# Patient Record
Sex: Male | Born: 2000 | Race: White | Hispanic: No | Marital: Single
Health system: Southern US, Community
[De-identification: ages and names within clinical notes are randomized; demographics above are authoritative.]

## PROBLEM LIST (undated history)

## (undated) ENCOUNTER — Ambulatory Visit: Payer: Self-pay | Source: Home / Self Care

## (undated) DIAGNOSIS — G40209 Localization-related (focal) (partial) symptomatic epilepsy and epileptic syndromes with complex partial seizures, not intractable, without status epilepticus: Secondary | ICD-10-CM

## (undated) DIAGNOSIS — L309 Dermatitis, unspecified: Secondary | ICD-10-CM

## (undated) DIAGNOSIS — R569 Unspecified convulsions: Secondary | ICD-10-CM

## (undated) HISTORY — DX: Unspecified convulsions: R56.9

---
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
## 2018-04-01 IMAGING — DX DG KNEE COMPLETE 4+V*L*
4 series · 4 of 4 positions shown · non-contrast
Comparison: None.

CLINICAL DATA: 16-year-old male status post fall while running with
injury to left knee.

EXAM:
LEFT KNEE - COMPLETE 4+ VIEW

[knee ap]
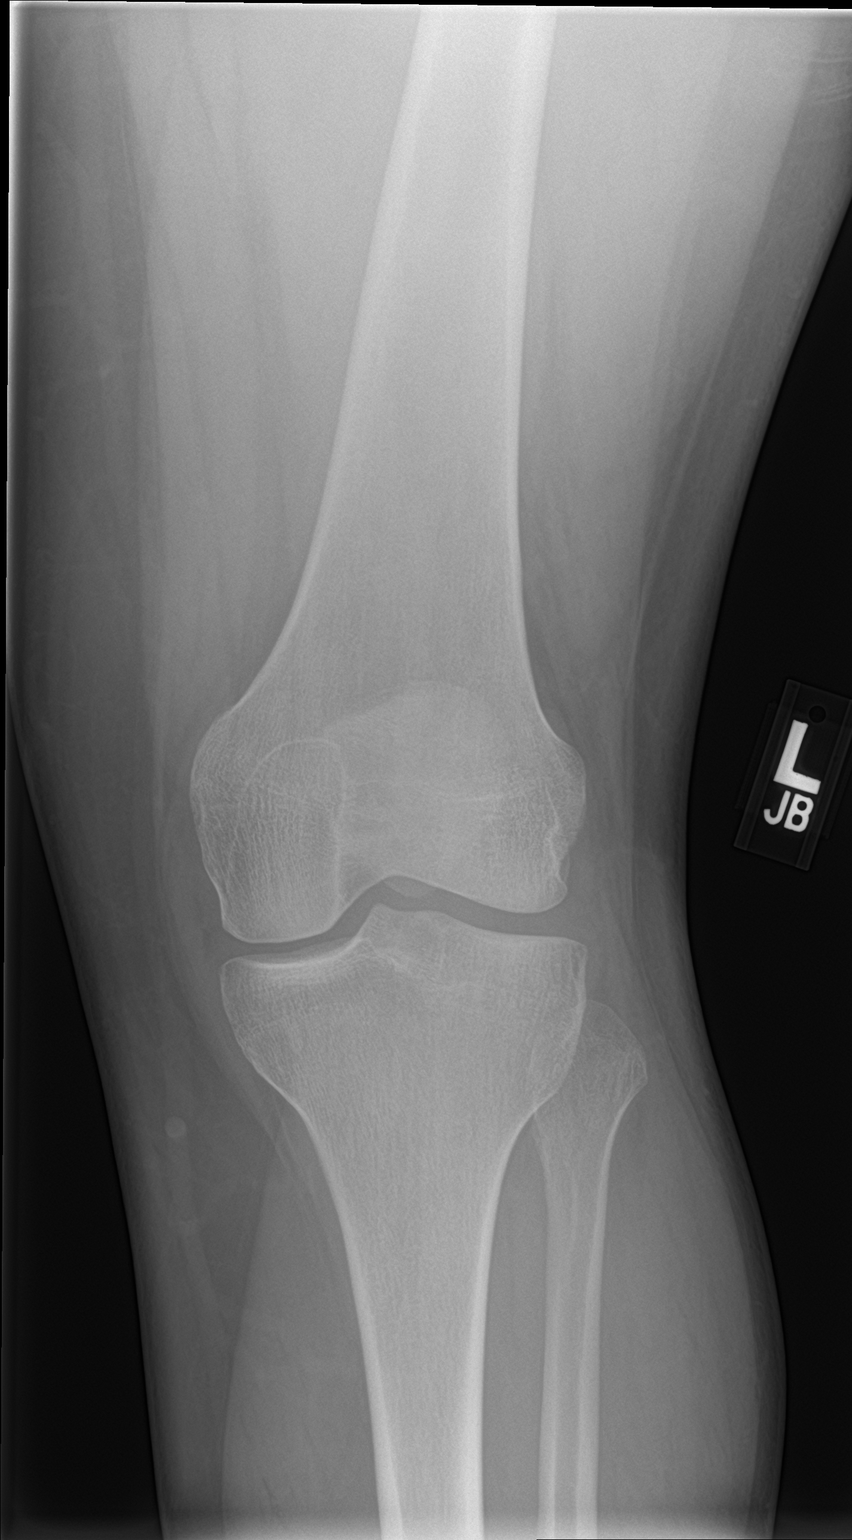

[knee lat]
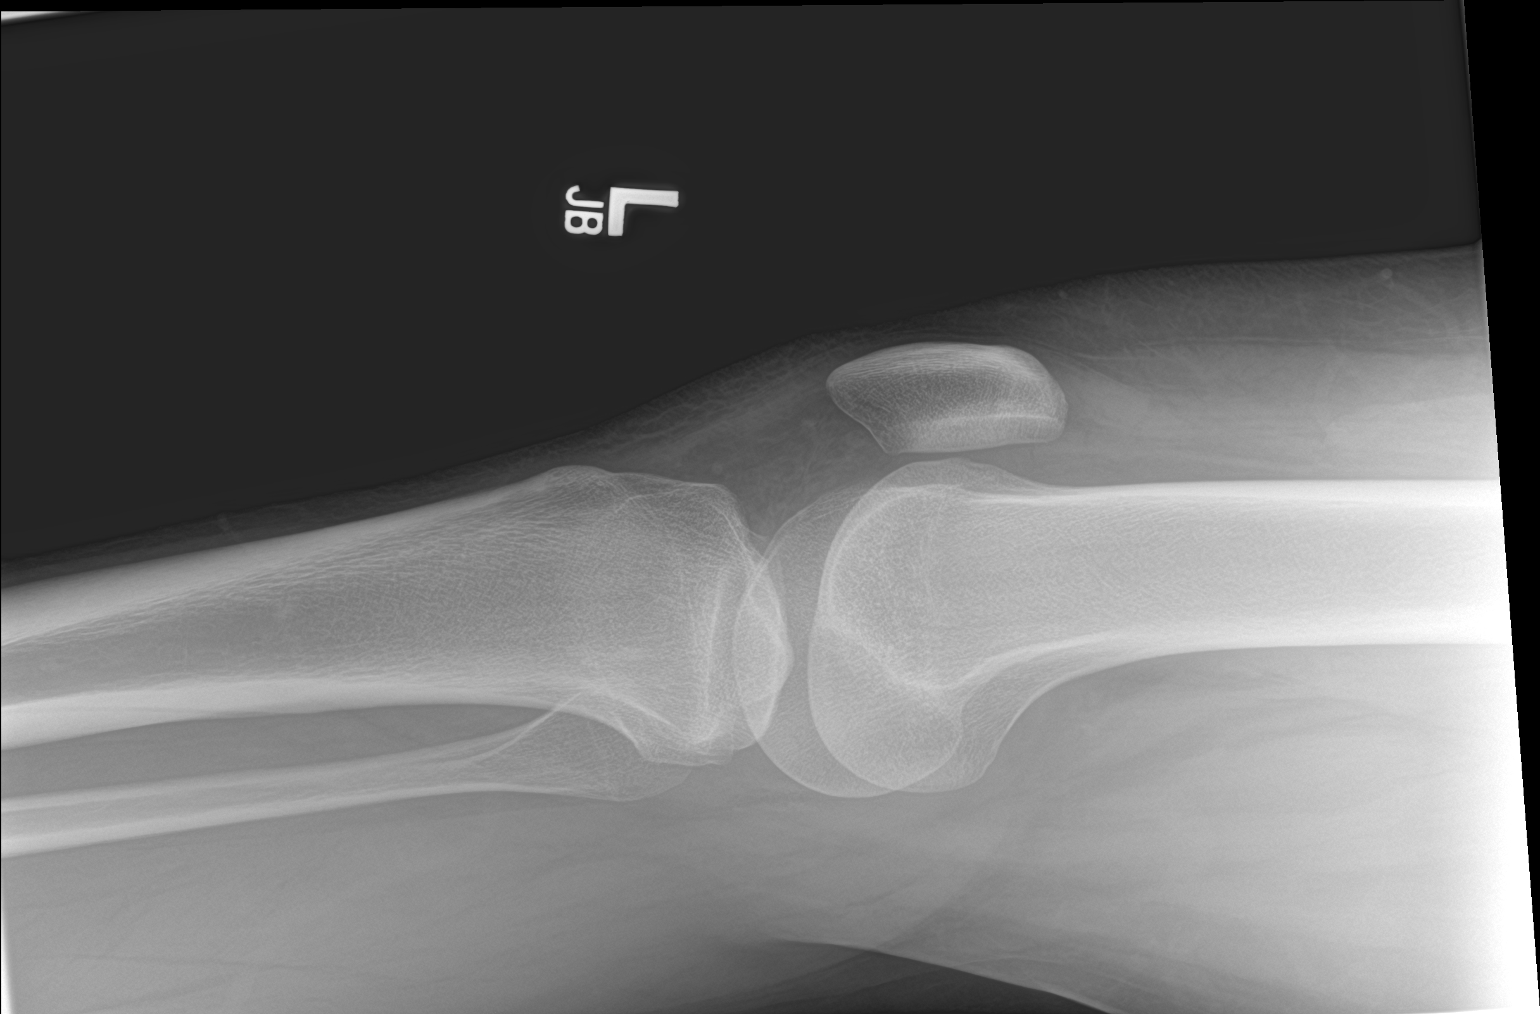

[knee obl (1 of 2)]
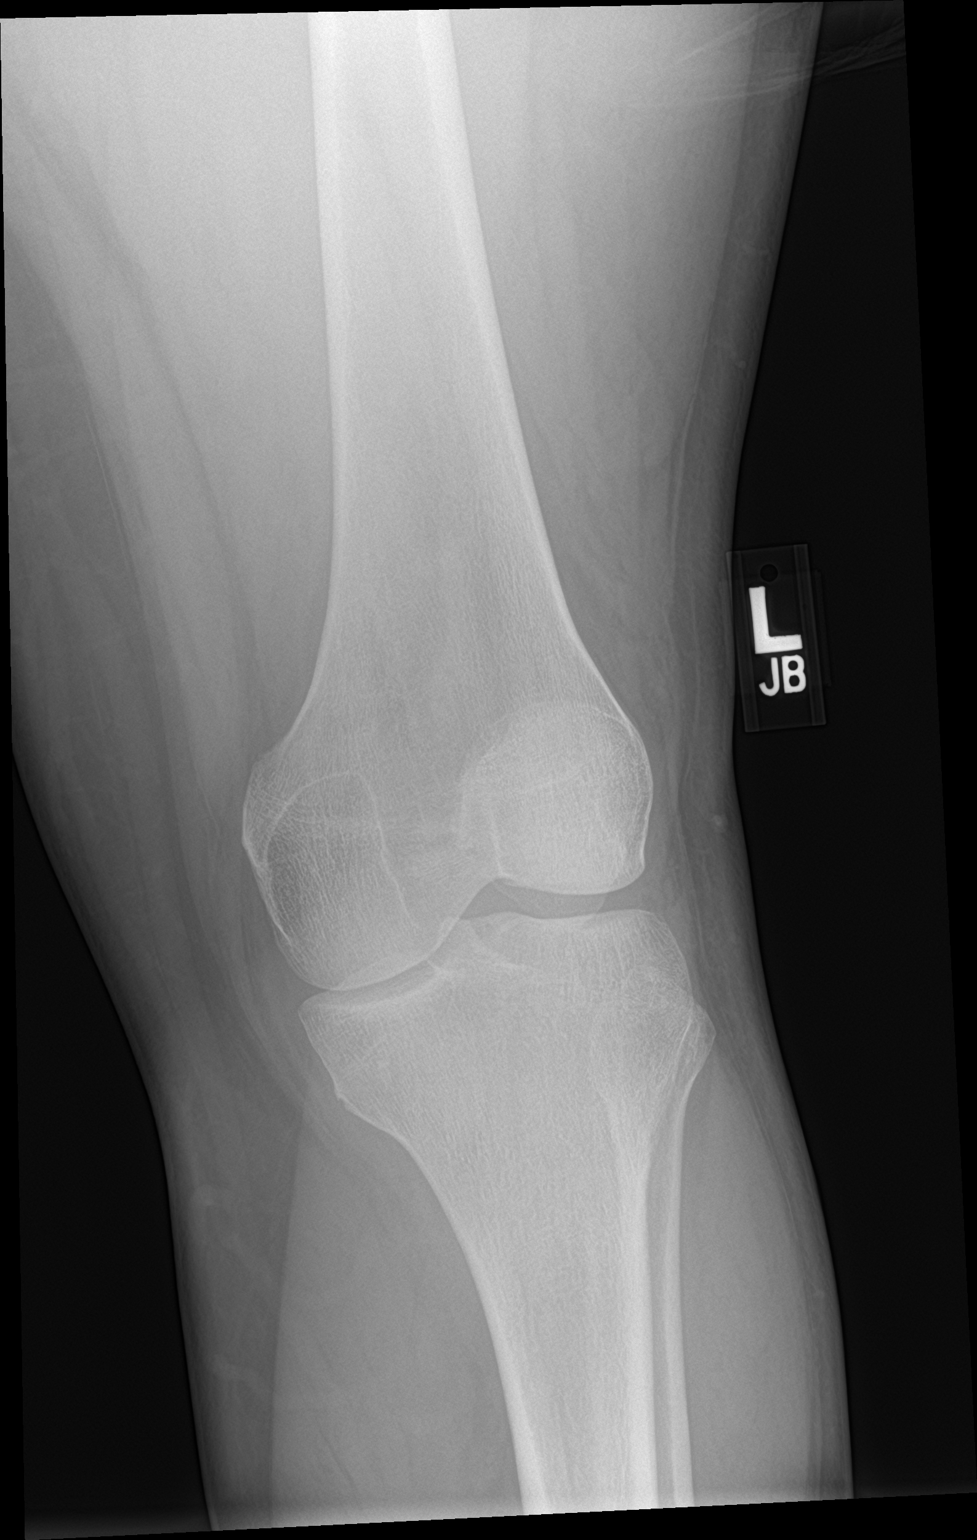

[knee obl (2 of 2)]
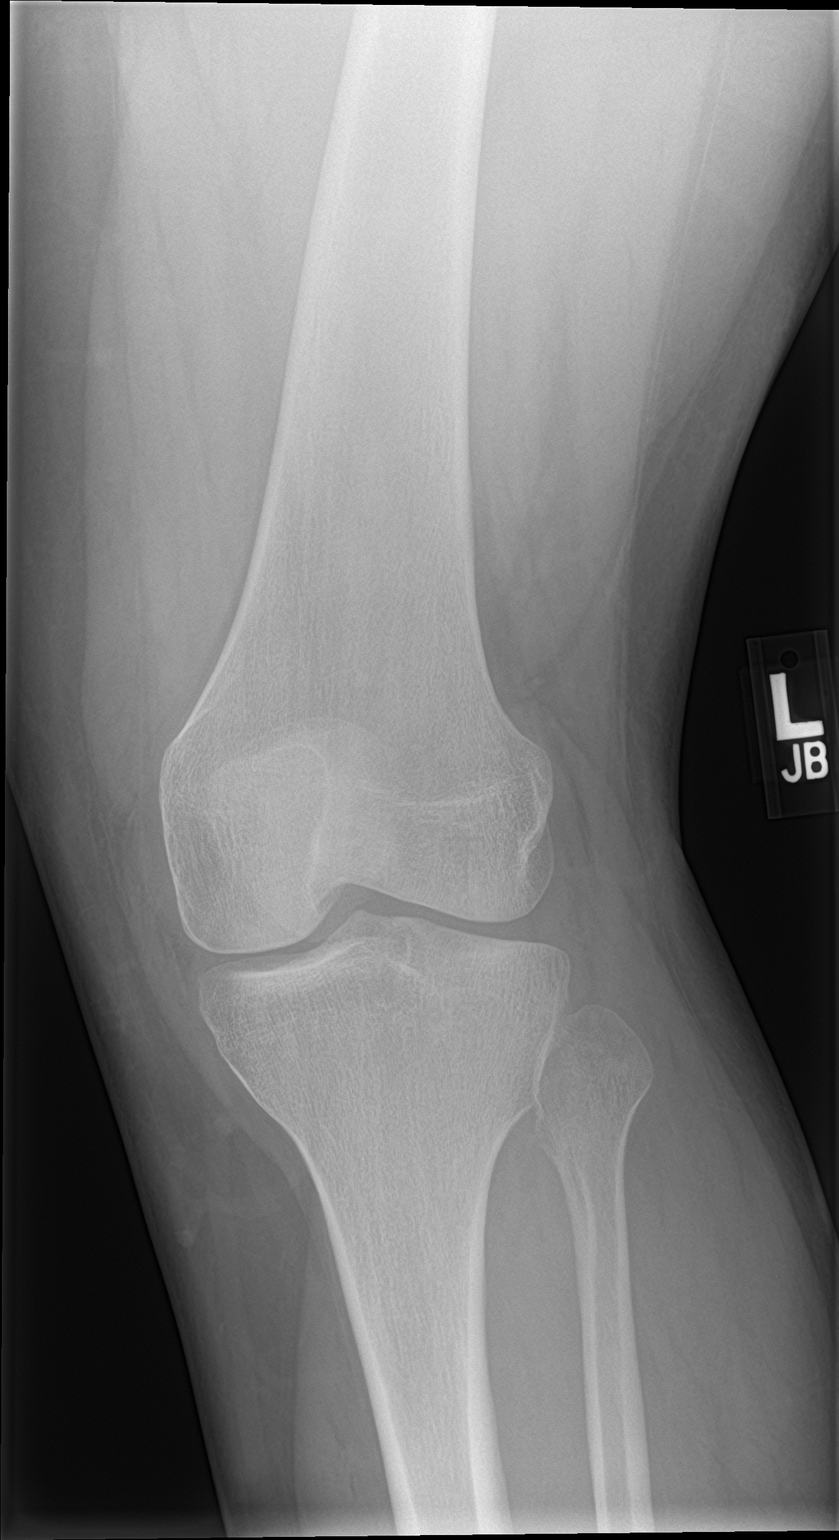

[4 of 4 positions shown; findings below may reference images not displayed]

FINDINGS: No evidence of fracture, dislocation, or joint effusion. No evidence
of arthropathy or other focal bone abnormality. Soft tissues are
unremarkable.
IMPRESSION: Negative.

## 2023-12-29 ENCOUNTER — Emergency Department (HOSPITAL_BASED_OUTPATIENT_CLINIC_OR_DEPARTMENT_OTHER)
Admission: EM | Admit: 2023-12-29 | Discharge: 2023-12-29 | Disposition: A | Discharge status: Home / Self Care | Payer: Self-pay | Attending: Emergency Medicine | Admitting: Emergency Medicine

## 2023-12-29 ENCOUNTER — Other Ambulatory Visit: Payer: Self-pay

## 2023-12-29 ENCOUNTER — Encounter (HOSPITAL_BASED_OUTPATIENT_CLINIC_OR_DEPARTMENT_OTHER): Payer: Self-pay

## 2023-12-29 DIAGNOSIS — T7840XA Allergy, unspecified, initial encounter: Secondary | ICD-10-CM

## 2023-12-29 DIAGNOSIS — L237 Allergic contact dermatitis due to plants, except food: Secondary | ICD-10-CM | POA: Insufficient documentation

## 2023-12-29 LAB — COMPREHENSIVE METABOLIC PANEL WITH GFR
ALT: 40 U/L (ref 0–44)
AST: 36 U/L (ref 15–41)
Albumin: 4.7 g/dL (ref 3.5–5.0)
Alkaline Phosphatase: 76 U/L (ref 38–126)
Anion gap: 9 (ref 5–15)
BUN: 8 mg/dL (ref 6–20)
CO2: 27 mmol/L (ref 22–32)
Calcium: 9.4 mg/dL (ref 8.9–10.3)
Chloride: 104 mmol/L (ref 98–111)
Creatinine, Ser: 0.83 mg/dL (ref 0.61–1.24)
GFR, Estimated: >60 mL/min (ref >60)
Glucose, Bld: 90 mg/dL (ref 70–99)
Potassium: 4.3 mmol/L (ref 3.5–5.1)
Sodium: 140 mmol/L (ref 135–145)
Total Bilirubin: 0.3 mg/dL (ref 0.0–1.2)
Total Protein: 7 g/dL (ref 6.5–8.1)

## 2023-12-29 LAB — CBC WITH DIFFERENTIAL/PLATELET
Abs Immature Granulocytes: 0.02 K/uL (ref 0.00–0.07)
Basophils Absolute: 0.1 K/uL (ref 0.0–0.1)
Basophils Relative: 1 %
Eosinophils Absolute: 0.4 K/uL (ref 0.0–0.5)
Eosinophils Relative: 4 %
HCT: 41.5 % (ref 39.0–52.0)
Hemoglobin: 14.3 g/dL (ref 13.0–17.0)
Immature Granulocytes: 0 %
Lymphocytes Relative: 22 %
Lymphs Abs: 2.3 K/uL (ref 0.7–4.0)
MCH: 30.1 pg (ref 26.0–34.0)
MCHC: 34.5 g/dL (ref 30.0–36.0)
MCV: 87.4 fL (ref 80.0–100.0)
Monocytes Absolute: 0.6 K/uL (ref 0.1–1.0)
Monocytes Relative: 6 %
Neutro Abs: 6.8 K/uL (ref 1.7–7.7)
Neutrophils Relative %: 67 %
Platelets: 308 K/uL (ref 150–400)
RBC: 4.75 MIL/uL (ref 4.22–5.81)
RDW: 12 % (ref 11.5–15.5)
WBC: 10.2 K/uL (ref 4.0–10.5)
nRBC: 0 % (ref 0.0–0.2)

## 2023-12-29 MED ORDER — TETRACAINE HCL 0.5 % OP SOLN
2.0000 [drp] | Freq: Once | OPHTHALMIC | Status: AC
  Administered 2023-12-29: 2 [drp] via OPHTHALMIC
  Filled 2023-12-29: qty 4

## 2023-12-29 MED ORDER — FLUORESCEIN SODIUM 1 MG OP STRP
1.0000 | ORAL_STRIP | Freq: Once | OPHTHALMIC | Status: AC
  Administered 2023-12-29: 1 via OPHTHALMIC
  Filled 2023-12-29: qty 1

## 2023-12-29 MED ORDER — FAMOTIDINE IN NACL 20-0.9 MG/50ML-% IV SOLN
20.0000 mg | Freq: Once | INTRAVENOUS | Status: AC
  Administered 2023-12-29: 20 mg via INTRAVENOUS
  Filled 2023-12-29: qty 50

## 2023-12-29 MED ORDER — PREDNISONE 10 MG (21) PO TBPK: ORAL_TABLET | Freq: Every day | ORAL | 0 refills | Status: DC

## 2023-12-29 MED ORDER — DIPHENHYDRAMINE HCL 50 MG/ML IJ SOLN
25.0000 mg | Freq: Once | INTRAMUSCULAR | Status: AC
  Administered 2023-12-29: 25 mg via INTRAVENOUS
  Filled 2023-12-29: qty 1

## 2023-12-29 MED ORDER — METHYLPREDNISOLONE SODIUM SUCC 125 MG IJ SOLR
125.0000 mg | Freq: Once | INTRAMUSCULAR | Status: AC
  Administered 2023-12-29: 125 mg via INTRAVENOUS
  Filled 2023-12-29: qty 2

## 2023-12-29 NOTE — ED Provider Notes (Signed)
  [place] EMERGENCY DEPARTMENT AT [facility] Provider Note   CSN: [id number] Arrival date & time: 12/29/23  1712     Patient presents with: Eye Problem (left)   Jonathan Bond is a 23 y.o. male who presents for eye swelling and a rash due to poison oak.  {Add pertinent medical, surgical, social history, OB history to YEP:67052}  Eye Problem      Prior to Admission medications   Medication Sig Start Date End Date Taking? Authorizing Provider  carbamazepine  (CARBATROL ) 100 MG 12 hr capsule 2 by mouth every morning x1 week, then 2 by mouth twice a day x1 week, then one by mouth every morning, 2 by mouth each bedtime x1 week, then one by mouth twice a day x1 week, then one each bedtime x1 week, then discontinue 12/08/12   Susen Elsie DEL, MD  HYDROcodone -acetaminophen  (NORCO/VICODIN) 5-325 MG tablet Take 1 tablet by mouth every 4 (four) hours as needed for severe pain. 06/05/15   Lang Maxwell, NP  hydrocortisone  valerate ointment (WESTCORT ) 0.2 % Apply 1 application topically 2 (two) times daily. To face 02/01/14   Ettie Gull, MD  nystatin  cream (MYCOSTATIN ) Apply to affected area 3 times daily to chest 02/01/14   Ettie Gull, MD  triamcinolone  ointment (KENALOG ) 0.1 % Apply topically 2 (two) times daily. To itchy bumpy areas 06/02/12   Ettefagh, Mallie Hamilton, MD    Allergies: Patient has no known allergies.    Review of Systems  Updated Vital Signs BP 127/85 (BP Location: Right Arm)   Pulse 72   Temp 98.2 F (36.8 C) (Oral)   Resp 16   Ht 5' 9 (1.753 m)   Wt 117.9 kg   SpO2 100%   BMI 38.40 kg/m   Physical Exam         (all labs ordered are listed, but only abnormal results are displayed) Labs Reviewed - No data to display  EKG: None  Radiology: No results found.  {Document cardiac monitor, telemetry assessment procedure when appropriate:32947} Procedures   Medications Ordered in the ED  tetracaine (PONTOCAINE) 0.5 % ophthalmic  solution 2 drop (2 drops Left Eye Given 12/29/23 2053)  fluorescein ophthalmic strip 1 strip (1 strip Left Eye Given 12/29/23 2053)      {Click here for ABCD2, HEART and other calculators REFRESH Note before signing:1}                              Medical Decision Making Amount and/or Complexity of Data Reviewed Labs: ordered.  Risk Prescription drug management.   ***  {Document critical care time when appropriate  Document review of labs and clinical decision tools ie CHADS2VASC2, etc  Document your independent review of radiology images and any outside records  Document your discussion with family members, caretakers and with consultants  Document social determinants of health affecting pt's care  Document your decision making why or why not admission, treatments were needed:32947:::1}   Final diagnoses:  None    ED Discharge Orders     None

## 2023-12-29 NOTE — Discharge Instructions (Addendum)
 Thank you for visiting the Emergency Department today. It was a pleasure to be part of your healthcare team.  You were seen today for poison oak. You have been treated with Solu-Medrol, Benadryl, Pepcid, and you should take your medications as directed. If you have any questions about your medicines, please call your pharmacy or healthcare provider. As discussed, it is important to watch for warning signs such as worsening pain, fever, or any additional eye changes or swelling. If any of these happen, return to the Emergency Department or call 911. Thank you for trusting us  with your health.

## 2023-12-29 NOTE — ED Triage Notes (Signed)
 Arrives POV with complaints increased eye swelling (normal vision) x2 days. Patient tried OTC drops and benadryl with minimal relief.

## 2024-02-28 ENCOUNTER — Other Ambulatory Visit: Payer: Self-pay

## 2024-02-28 ENCOUNTER — Encounter (HOSPITAL_BASED_OUTPATIENT_CLINIC_OR_DEPARTMENT_OTHER): Payer: Self-pay

## 2024-02-28 ENCOUNTER — Emergency Department (HOSPITAL_BASED_OUTPATIENT_CLINIC_OR_DEPARTMENT_OTHER)
Admission: EM | Admit: 2024-02-28 | Discharge: 2024-02-28 | Disposition: A | Discharge status: Home / Self Care | Payer: Self-pay | Attending: Emergency Medicine | Admitting: Emergency Medicine

## 2024-02-28 DIAGNOSIS — L237 Allergic contact dermatitis due to plants, except food: Secondary | ICD-10-CM | POA: Insufficient documentation

## 2024-02-28 DIAGNOSIS — Z79899 Other long term (current) drug therapy: Secondary | ICD-10-CM | POA: Insufficient documentation

## 2024-02-28 MED ORDER — PREDNISONE 50 MG PO TABS
60.0000 mg | ORAL_TABLET | Freq: Once | ORAL | Status: AC
  Administered 2024-02-28: 60 mg via ORAL
  Filled 2024-02-28: qty 1

## 2024-02-28 MED ORDER — PREDNISONE 20 MG PO TABS: ORAL_TABLET | ORAL | 0 refills | Status: AC

## 2024-02-28 NOTE — ED Triage Notes (Signed)
 Pt reports he thinks he may have poison oak under his right eye. Itching, redness and swelling noted below his right eye. Denies vision changes.

## 2024-02-28 NOTE — Discharge Instructions (Signed)
 Follow-up with your family doctor in the office.  Please return for eye pain or change in vision.

## 2024-02-28 NOTE — ED Provider Notes (Signed)
 " [facility] AT [facility] Provider Note   CSN: [id number] Arrival date & time: 02/28/24  8042     Patient presents with: Rash   Jonathan Bond is a 24 y.o. male.   24 yo M with a chief complaints of an itchy rash to his face.  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  Has had this happen to him before.  Denies any eye pain or change in vision.   Rash      Prior to Admission medications  Medication Sig Start Date End Date Taking? Authorizing Provider  predniSONE  (DELTASONE ) 20 MG tablet 3 tabs po daily x 3 days, then 2 tabs x 3 days, then 1.5 tabs x 3 days, then 1 tab x 3 days, then 0.5 tabs x 3 days 02/28/24  Yes Altheria Shadoan, DO  carbamazepine  (CARBATROL ) 100 MG 12 hr capsule 2 by mouth every morning x1 week, then 2 by mouth twice a day x1 week, then one by mouth every morning, 2 by mouth each bedtime x1 week, then one by mouth twice a day x1 week, then one each bedtime x1 week, then discontinue 12/08/12   Susen Elsie DEL, MD  HYDROcodone -acetaminophen  (NORCO/VICODIN) 5-325 MG tablet Take 1 tablet by mouth every 4 (four) hours as needed for severe pain. 06/05/15   Lang Maxwell, NP  hydrocortisone  valerate ointment (WESTCORT ) 0.2 % Apply 1 application topically 2 (two) times daily. To face 02/01/14   Ettie Gull, MD  nystatin  cream (MYCOSTATIN ) Apply to affected area 3 times daily to chest 02/01/14   Ettie Gull, MD  triamcinolone  ointment (KENALOG ) 0.1 % Apply topically 2 (two) times daily. To itchy bumpy areas 06/02/12   Ettefagh, Mallie Hamilton, MD    Allergies: Patient has no known allergies.    Review of Systems  Skin:  Positive for rash.    Updated Vital Signs BP 139/86   Pulse (!) 112   Temp 98.7 F (37.1 C)   Resp 20   Ht 5' 9 (1.753 m)   Wt 117.9 kg   SpO2 100%   BMI 38.40 kg/m   Physical Exam Vitals and nursing note reviewed.  Constitutional:      Appearance: He is well-developed.  HENT:      Head: Normocephalic and atraumatic.  Eyes:     Pupils: Pupils are equal, round, and reactive to light.     Comments: Patient has some periorbital erythema, no conjunctival injection some erythema also posterior to the right ear.  Neck:     Vascular: No JVD.  Cardiovascular:     Rate and Rhythm: Normal rate and regular rhythm.     Heart sounds: No murmur heard.    No friction rub. No gallop.  Pulmonary:     Effort: No respiratory distress.     Breath sounds: No wheezing.  Abdominal:     General: There is no distension.     Tenderness: There is no abdominal tenderness. There is no guarding or rebound.  Musculoskeletal:        General: Normal range of motion.     Cervical back: Normal range of motion and neck supple.  Skin:    Coloration: Skin is not pale.     Findings: No rash.  Neurological:     Mental Status: He is alert and oriented to person, place, and time.  Psychiatric:        Behavior: Behavior normal.     (all labs  ordered are listed, but only abnormal results are displayed) Labs Reviewed - No data to display  EKG: None  Radiology: No results found.   Procedures   Medications Ordered in the ED  predniSONE  (DELTASONE ) tablet 60 mg (has no administration in time range)                                    Medical Decision Making Risk Prescription drug management.   24 yo M with a chief complaints of a itchy rash to the face.  He thinks he was exposed to poison ivy.  He does tree work for living.  I think it is consistent with poison ivy contact dermatitis.  As it is on the face we will start him on systemic corticosteroids.  PCP follow-up.  8:19 PM:  I have discussed the diagnosis/risks/treatment options with the patient.  Evaluation and diagnostic testing in the emergency Bond does not suggest an emergent condition requiring admission or immediate intervention beyond what has been performed at this time.  They will follow up with PCP. We also discussed  returning to the ED immediately if new or worsening sx occur. We discussed the sx which are most concerning (e.g., sudden worsening pain, fever, inability to tolerate by mouth) that necessitate immediate return. Medications administered to the patient during their visit and any new prescriptions provided to the patient are listed below.  Medications given during this visit Medications  predniSONE  (DELTASONE ) tablet 60 mg (has no administration in time range)     The patient appears reasonably screen and/or stabilized for discharge and I doubt any other medical condition or other [facility] requiring further screening, evaluation, or treatment in the ED at this time prior to discharge.       Final diagnoses:  Allergic contact dermatitis due to plants, except food    ED Discharge Orders          Ordered    predniSONE  (DELTASONE ) 20 MG tablet        02/28/24 2018               [name], DO 02/28/24 2019  "
